# Patient Record
Sex: Male | Born: 1980 | Race: Black or African American | Hispanic: No | Marital: Married | Smoking: Former smoker
Health system: Southern US, Community
[De-identification: ages and names within clinical notes are randomized; demographics above are authoritative.]

## PROBLEM LIST (undated history)

## (undated) DIAGNOSIS — I10 Essential (primary) hypertension: Secondary | ICD-10-CM

## (undated) DIAGNOSIS — W3400XA Accidental discharge from unspecified firearms or gun, initial encounter: Secondary | ICD-10-CM

---
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
## 2002-03-07 ENCOUNTER — Emergency Department (HOSPITAL_COMMUNITY): Admission: EM | Admit: 2002-03-07 | Discharge: 2002-03-07 | Payer: Self-pay | Admitting: Emergency Medicine

## 2003-05-24 DIAGNOSIS — W3400XA Accidental discharge from unspecified firearms or gun, initial encounter: Secondary | ICD-10-CM

## 2003-05-24 HISTORY — DX: Accidental discharge from unspecified firearms or gun, initial encounter: W34.00XA

## 2004-02-07 ENCOUNTER — Encounter (INDEPENDENT_AMBULATORY_CARE_PROVIDER_SITE_OTHER): Payer: Self-pay | Admitting: *Deleted

## 2004-02-07 ENCOUNTER — Inpatient Hospital Stay (HOSPITAL_COMMUNITY): Admission: AC | Admit: 2004-02-07 | Discharge: 2004-02-13 | Payer: Self-pay

## 2004-02-15 ENCOUNTER — Inpatient Hospital Stay (HOSPITAL_COMMUNITY): Admission: EM | Admit: 2004-02-15 | Discharge: 2004-02-19 | Payer: Self-pay | Admitting: Emergency Medicine

## 2004-02-21 ENCOUNTER — Inpatient Hospital Stay (HOSPITAL_COMMUNITY): Admission: EM | Admit: 2004-02-21 | Discharge: 2004-02-24 | Payer: Self-pay | Admitting: Emergency Medicine

## 2004-03-09 ENCOUNTER — Encounter: Admission: RE | Admit: 2004-03-09 | Discharge: 2004-04-09 | Payer: Self-pay | Admitting: Physician Assistant

## 2004-03-16 ENCOUNTER — Emergency Department (HOSPITAL_COMMUNITY): Admission: EM | Admit: 2004-03-16 | Discharge: 2004-03-16 | Payer: Self-pay | Admitting: Emergency Medicine

## 2005-07-16 ENCOUNTER — Emergency Department (HOSPITAL_COMMUNITY): Admission: EM | Admit: 2005-07-16 | Discharge: 2005-07-16 | Payer: Self-pay | Admitting: Emergency Medicine
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
## 2005-09-15 ENCOUNTER — Emergency Department (HOSPITAL_COMMUNITY): Admission: EM | Admit: 2005-09-15 | Discharge: 2005-09-15 | Payer: Self-pay | Admitting: Emergency Medicine

## 2005-11-04 ENCOUNTER — Emergency Department (HOSPITAL_COMMUNITY): Admission: EM | Admit: 2005-11-04 | Discharge: 2005-11-05 | Payer: Self-pay | Admitting: Emergency Medicine

## 2006-06-15 ENCOUNTER — Emergency Department (HOSPITAL_COMMUNITY): Admission: EM | Admit: 2006-06-15 | Discharge: 2006-06-15 | Payer: Self-pay | Admitting: Emergency Medicine

## 2006-07-19 ENCOUNTER — Ambulatory Visit: Payer: Self-pay | Admitting: Family Medicine

## 2006-10-10 ENCOUNTER — Emergency Department (HOSPITAL_COMMUNITY): Admission: EM | Admit: 2006-10-10 | Discharge: 2006-10-10 | Payer: Self-pay | Admitting: Emergency Medicine

## 2006-10-20 ENCOUNTER — Emergency Department (HOSPITAL_COMMUNITY): Admission: EM | Admit: 2006-10-20 | Discharge: 2006-10-20 | Payer: Self-pay | Admitting: *Deleted

## 2006-12-12 ENCOUNTER — Emergency Department (HOSPITAL_COMMUNITY): Admission: EM | Admit: 2006-12-12 | Discharge: 2006-12-12 | Payer: Self-pay | Admitting: Emergency Medicine

## 2006-12-24 ENCOUNTER — Emergency Department (HOSPITAL_COMMUNITY): Admission: EM | Admit: 2006-12-24 | Discharge: 2006-12-24 | Payer: Self-pay | Admitting: Emergency Medicine

## 2007-01-20 ENCOUNTER — Emergency Department (HOSPITAL_COMMUNITY): Admission: EM | Admit: 2007-01-20 | Discharge: 2007-01-20 | Payer: Self-pay | Admitting: Emergency Medicine

## 2008-05-23 HISTORY — PX: COLOSTOMY REVERSAL: SHX5782

## 2008-10-13 ENCOUNTER — Emergency Department (HOSPITAL_COMMUNITY): Admission: EM | Admit: 2008-10-13 | Discharge: 2008-10-14 | Payer: Self-pay | Admitting: Emergency Medicine

## 2010-08-31 LAB — RAPID STREP SCREEN (MED CTR MEBANE ONLY): Streptococcus, Group A Screen (Direct): NEGATIVE
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
## 2011-03-04 LAB — CBC
HCT: 49.2
Hemoglobin: 16.7
MCHC: 33.9
MCV: 97.4
Platelets: 146 — ABNORMAL LOW
RBC: 5.05
RDW: 13.6
WBC: 8.1

## 2011-03-04 LAB — URINALYSIS, ROUTINE W REFLEX MICROSCOPIC
Bilirubin Urine: NEGATIVE
Glucose, UA: NEGATIVE
Hgb urine dipstick: NEGATIVE
Ketones, ur: NEGATIVE
Leukocytes, UA: NEGATIVE
Nitrite: NEGATIVE
Protein, ur: 100 — AB
Specific Gravity, Urine: 1.028
Urobilinogen, UA: 1
pH: 8.5 — ABNORMAL HIGH

## 2011-03-04 LAB — URINE MICROSCOPIC-ADD ON

## 2011-03-04 LAB — COMPREHENSIVE METABOLIC PANEL
ALT: 13
AST: 24
Albumin: 4.5
Alkaline Phosphatase: 58
BUN: 5 — ABNORMAL LOW
CO2: 28
Calcium: 9.9
Chloride: 103
Creatinine, Ser: 1.16
GFR calc Af Amer: >60
GFR calc non Af Amer: >60
Glucose, Bld: 91
Potassium: 4.6
Sodium: 139
Total Bilirubin: 0.9
Total Protein: 7.8

## 2011-03-04 LAB — DIFFERENTIAL
Basophils Absolute: 0.1
Basophils Relative: 1
Eosinophils Absolute: 0
Eosinophils Relative: 0
Lymphocytes Relative: 21
Lymphs Abs: 1.7
Monocytes Absolute: 0.5
Monocytes Relative: 6
Neutro Abs: 5.8
Neutrophils Relative %: 72

## 2011-03-04 LAB — LIPASE, BLOOD: Lipase: 19

## 2014-07-09 ENCOUNTER — Emergency Department (HOSPITAL_COMMUNITY)
Admission: EM | Admit: 2014-07-09 | Discharge: 2014-07-09 | Disposition: A | Discharge status: Home / Self Care | Payer: BLUE CROSS/BLUE SHIELD | Attending: Emergency Medicine | Admitting: Emergency Medicine

## 2014-07-09 ENCOUNTER — Encounter (HOSPITAL_COMMUNITY): Payer: Self-pay | Admitting: Emergency Medicine

## 2014-07-09 DIAGNOSIS — R51 Headache: Secondary | ICD-10-CM | POA: Insufficient documentation

## 2014-07-09 DIAGNOSIS — Z87828 Personal history of other (healed) physical injury and trauma: Secondary | ICD-10-CM | POA: Insufficient documentation

## 2014-07-09 DIAGNOSIS — N179 Acute kidney failure, unspecified: Secondary | ICD-10-CM

## 2014-07-09 DIAGNOSIS — R197 Diarrhea, unspecified: Secondary | ICD-10-CM | POA: Insufficient documentation

## 2014-07-09 DIAGNOSIS — Z87891 Personal history of nicotine dependence: Secondary | ICD-10-CM | POA: Diagnosis not present

## 2014-07-09 DIAGNOSIS — R112 Nausea with vomiting, unspecified: Secondary | ICD-10-CM

## 2014-07-09 DIAGNOSIS — I10 Essential (primary) hypertension: Secondary | ICD-10-CM | POA: Diagnosis not present

## 2014-07-09 HISTORY — DX: Accidental discharge from unspecified firearms or gun, initial encounter: W34.00XA

## 2014-07-09 HISTORY — DX: Essential (primary) hypertension: I10

## 2014-07-09 LAB — COMPREHENSIVE METABOLIC PANEL
ALT: 24 U/L (ref 0–53)
AST: 32 U/L (ref 0–37)
Albumin: 4.3 g/dL (ref 3.5–5.2)
Alkaline Phosphatase: 55 U/L (ref 39–117)
Anion gap: 4 — ABNORMAL LOW (ref 5–15)
BUN: 10 mg/dL (ref 6–23)
CO2: 33 mmol/L — ABNORMAL HIGH (ref 19–32)
Calcium: 9.5 mg/dL (ref 8.4–10.5)
Chloride: 100 mmol/L (ref 96–112)
Creatinine, Ser: 1.66 mg/dL — ABNORMAL HIGH (ref 0.50–1.35)
GFR calc Af Amer: 62 mL/min — ABNORMAL LOW (ref >90)
GFR calc non Af Amer: 53 mL/min — ABNORMAL LOW (ref >90)
Glucose, Bld: 136 mg/dL — ABNORMAL HIGH (ref 70–99)
Potassium: 3.8 mmol/L (ref 3.5–5.1)
Sodium: 137 mmol/L (ref 135–145)
Total Bilirubin: 1.3 mg/dL — ABNORMAL HIGH (ref 0.3–1.2)
Total Protein: 7.8 g/dL (ref 6.0–8.3)

## 2014-07-09 LAB — CBC WITH DIFFERENTIAL/PLATELET
Basophils Absolute: 0 10*3/uL (ref 0.0–0.1)
Basophils Relative: 0 % (ref 0–1)
Eosinophils Absolute: 0 10*3/uL (ref 0.0–0.7)
Eosinophils Relative: 0 % (ref 0–5)
HCT: 49.7 % (ref 39.0–52.0)
Hemoglobin: 17.4 g/dL — ABNORMAL HIGH (ref 13.0–17.0)
Lymphocytes Relative: 6 % — ABNORMAL LOW (ref 12–46)
Lymphs Abs: 0.5 10*3/uL — ABNORMAL LOW (ref 0.7–4.0)
MCH: 32.4 pg (ref 26.0–34.0)
MCHC: 35 g/dL (ref 30.0–36.0)
MCV: 92.6 fL (ref 78.0–100.0)
Monocytes Absolute: 0.7 10*3/uL (ref 0.1–1.0)
Monocytes Relative: 8 % (ref 3–12)
Neutro Abs: 7.1 10*3/uL (ref 1.7–7.7)
Neutrophils Relative %: 86 % — ABNORMAL HIGH (ref 43–77)
Platelets: 124 10*3/uL — ABNORMAL LOW (ref 150–400)
RBC: 5.37 MIL/uL (ref 4.22–5.81)
RDW: 13.6 % (ref 11.5–15.5)
WBC: 8.2 10*3/uL (ref 4.0–10.5)

## 2014-07-09 LAB — URINALYSIS, ROUTINE W REFLEX MICROSCOPIC
Bilirubin Urine: NEGATIVE
Glucose, UA: NEGATIVE mg/dL
Hgb urine dipstick: NEGATIVE
Ketones, ur: NEGATIVE mg/dL
Leukocytes, UA: NEGATIVE
Nitrite: NEGATIVE
Protein, ur: NEGATIVE mg/dL
Specific Gravity, Urine: 1.026 (ref 1.005–1.030)
Urobilinogen, UA: 1 mg/dL (ref 0.0–1.0)
pH: 7 (ref 5.0–8.0)

## 2014-07-09 LAB — LIPASE, BLOOD: Lipase: 31 U/L (ref 11–59)

## 2014-07-09 MED ORDER — ONDANSETRON HCL 4 MG/2ML IJ SOLN
4.0000 mg | Freq: Once | INTRAMUSCULAR | Status: AC
  Administered 2014-07-09: 4 mg via INTRAVENOUS
  Filled 2014-07-09: qty 2

## 2014-07-09 MED ORDER — SODIUM CHLORIDE 0.9 % IV BOLUS (SEPSIS)
1000.0000 mL | Freq: Once | INTRAVENOUS | Status: AC
  Administered 2014-07-09: 1000 mL via INTRAVENOUS

## 2014-07-09 MED ORDER — ONDANSETRON 4 MG PO TBDP: 4.0000 mg | ORAL_TABLET | Freq: Three times a day (TID) | ORAL | Status: AC | PRN

## 2014-07-09 MED ORDER — DICYCLOMINE HCL 20 MG PO TABS: 20.0000 mg | ORAL_TABLET | Freq: Two times a day (BID) | ORAL | Status: AC

## 2014-07-09 MED ORDER — KETOROLAC TROMETHAMINE 30 MG/ML IJ SOLN
30.0000 mg | Freq: Once | INTRAMUSCULAR | Status: AC
  Administered 2014-07-09: 30 mg via INTRAVENOUS
  Filled 2014-07-09: qty 1

## 2014-07-09 MED ORDER — ACETAMINOPHEN 500 MG PO TABS
1000.0000 mg | ORAL_TABLET | Freq: Once | ORAL | Status: AC
  Administered 2014-07-09: 1000 mg via ORAL
  Filled 2014-07-09: qty 2

## 2014-07-09 MED ORDER — LOPERAMIDE HCL 2 MG PO CAPS: 2.0000 mg | ORAL_CAPSULE | Freq: Four times a day (QID) | ORAL | Status: AC | PRN
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
## 2014-07-09 NOTE — Discharge Instructions (Signed)
You were seen in the emergency department for nausea, vomiting and diarrhea. This is likely caused by a viral gastroenteritis. This may take up to 7-10 days to resolve. If you notice worsening abdominal pain, vomiting and cannot stop, blood in your stool or black and tarry stools, please return to the hospital.  You were also found to have slightly elevated kidney function called creatinine. Your last creatinine was 1.16 and today is 1.66.  This is likely secondary to dehydration. We have given you 2 L of IV fluids in the emergency department and I recommend you increase your water intake. You'll need to have your creatinine rechecked by primary care physician in the next 1-2 weeks. Please avoid NSAIDs such as ibuprofen (motrin or Advil), aspirin, Aleve (naproxen). You may use Tylenol 1000 g every 6 hours as needed for fever and pain over-the-counter.   Acute Kidney Injury Acute kidney injury is a disease in which there is sudden (acute) damage to the kidneys. The kidneys are 2 organs that lie on either side of the spine between the middle of the back and the front of the abdomen. The kidneys:  Remove wastes and extra water from the blood.   Produce important hormones. These help keep bones strong, regulate blood pressure, and help create red blood cells.   Balance the fluids and chemicals in the blood and tissues. A small amount of kidney damage may not cause problems, but a large amount of damage may make it difficult or impossible for the kidneys to work the way they should. Acute kidney injury may develop into long-lasting (chronic) kidney disease. It may also develop into a life-threatening disease called end-stage kidney disease. Acute kidney injury can get worse very quickly, so it should be treated right away. Early treatment may prevent other kidney diseases from developing.  CAUSES   A problem with blood flow to the kidneys. This may be caused by:   Blood loss.   Heart disease.    Severe burns.   Liver disease.  Direct damage to the kidneys. This may be caused by:  Some medicines.   A kidney infection.   Poisoning or consuming toxic substances.   A surgical wound.   A blow to the kidney area.   A problem with urine flow. This may be caused by:   Cancer.   Kidney stones.   An enlarged prostate. SYMPTOMS   Swelling (edema) of the legs, ankles, or feet.   Tiredness (lethargy).   Nausea or vomiting.   Confusion.   Problems with urination, such as:   Painful or burning feeling during urination.   Decreased urine production.   Frequent accidents in children who are potty trained.   Bloody urine.   Muscle twitches and cramps.   Shortness of breath.   Seizures.   Chest pain or pressure. Sometimes, no symptoms are present. DIAGNOSIS Acute kidney injury may be detected and diagnosed by tests, including blood, urine, imaging, or kidney biopsy tests.  TREATMENT Treatment of acute kidney injury varies depending on the cause and severity of the kidney damage. In mild cases, no treatment may be needed. The kidneys may heal on their own. If acute kidney injury is more severe, your caregiver will treat the cause of the kidney damage, help the kidneys heal, and prevent complications from occurring. Severe cases may require a procedure to remove toxic wastes from the body (dialysis) or surgery to repair kidney damage. Surgery may involve:   Repair of a torn kidney.  Removal of an obstruction. Most of the time, you will need to stay overnight at the hospital.  HOME CARE INSTRUCTIONS:  Follow your prescribed diet.  Only take over-the-counter or prescription medicines as directed by your caregiver.  Do not take any new medicines (prescription, over-the-counter, or nutritional supplements) unless approved by your caregiver. Many medicines can worsen your kidney damage or need to have the dose adjusted.   Keep all  follow-up appointments as directed by your caregiver.  Observe your condition to make sure you are healing as expected. SEEK IMMEDIATE MEDICAL CARE IF:  You are feeling ill or have severe pain in the back or side.   Your symptoms return or you have new symptoms.  You have any symptoms of end-stage kidney disease. These include:   Persistent itchiness.   Loss of appetite.   Headaches.   Abnormally dark or light skin.  Numbness in the hands or feet.   Easy bruising.   Frequent hiccups.   Menstruation stops.   You have a fever.  You have increased urine production.  You have pain or bleeding when urinating. MAKE SURE YOU:   Understand these instructions.  Will watch your condition.  Will get help right away if you are not doing well or get worse Document Released: 11/22/2010 Document Revised: 09/03/2012 Document Reviewed: 01/06/2012 [facility] Patient Information 2015 [place], [place]. This information is not intended to replace advice given to you by your health care provider. Make sure you discuss any questions you have with your health care provider.   Dehydration, Adult Dehydration is when you lose more fluids from the body than you take in. Vital organs like the kidneys, brain, and heart cannot function without a proper amount of fluids and salt. Any loss of fluids from the body can cause dehydration.  CAUSES   Vomiting.  Diarrhea.  Excessive sweating.  Excessive urine output.  Fever. SYMPTOMS  Mild dehydration  Thirst.  Dry lips.  Slightly dry mouth. Moderate dehydration  Very dry mouth.  Sunken eyes.  Skin does not bounce back quickly when lightly pinched and released.  Dark urine and decreased urine production.  Decreased tear production.  Headache. Severe dehydration  Very dry mouth.  Extreme thirst.  Rapid, weak pulse (more than 100 beats per minute at rest).  Cold hands and feet.  Not able to sweat in spite of heat  and temperature.  Rapid breathing.  Blue lips.  Confusion and lethargy.  Difficulty being awakened.  Minimal urine production.  No tears. DIAGNOSIS  Your caregiver will diagnose dehydration based on your symptoms and your exam. Blood and urine tests will help confirm the diagnosis. The diagnostic evaluation should also identify the cause of dehydration. TREATMENT  Treatment of mild or moderate dehydration can often be done at home by increasing the amount of fluids that you drink. It is best to drink small amounts of fluid more often. Drinking too much at one time can make vomiting worse. Refer to the home care instructions below. Severe dehydration needs to be treated at the hospital where you will probably be given intravenous (IV) fluids that contain water and electrolytes. HOME CARE INSTRUCTIONS   Ask your caregiver about specific rehydration instructions.  Drink enough fluids to keep your urine clear or pale yellow.  Drink small amounts frequently if you have nausea and vomiting.  Eat as you normally do.  Avoid:  Foods or drinks high in sugar.  Carbonated drinks.  Juice.  Extremely hot or cold fluids.  Drinks with caffeine.  Fatty, greasy foods.  Alcohol.  Tobacco.  Overeating.  Gelatin desserts.  Wash your hands well to avoid spreading bacteria and viruses.  Only take over-the-counter or prescription medicines for pain, discomfort, or fever as directed by your caregiver.  Ask your caregiver if you should continue all prescribed and over-the-counter medicines.  Keep all follow-up appointments with your caregiver. SEEK MEDICAL CARE IF:  You have abdominal pain and it increases or stays in one area (localizes).  You have a rash, stiff neck, or severe headache.  You are irritable, sleepy, or difficult to awaken.  You are weak, dizzy, or extremely thirsty. SEEK IMMEDIATE MEDICAL CARE IF:   You are unable to keep fluids down or you get worse despite  treatment.  You have frequent episodes of vomiting or diarrhea.  You have blood or green matter (bile) in your vomit.  You have blood in your stool or your stool looks black and tarry.  You have not urinated in 6 to 8 hours, or you have only urinated a small amount of very dark urine.  You have a fever.  You faint. MAKE SURE YOU:   Understand these instructions.  Will watch your condition.  Will get help right away if you are not doing well or get worse. Document Released: 05/09/2005 Document Revised: 08/01/2011 Document Reviewed: 12/27/2010 [employer] Patient Information 2015 [place], [place]. This information is not intended to replace advice given to you by your health care provider. Make sure you discuss any questions you have with your health care provider.   Viral Gastroenteritis Viral gastroenteritis is also known as stomach flu. This condition affects the stomach and intestinal tract. It can cause sudden diarrhea and vomiting. The illness typically lasts 3 to 8 days. Most people develop an immune response that eventually gets rid of the virus. While this natural response develops, the virus can make you quite ill. CAUSES  Many different viruses can cause gastroenteritis, such as rotavirus or noroviruses. You can catch one of these viruses by consuming contaminated food or water. You may also catch a virus by sharing utensils or other personal items with an infected person or by touching a contaminated surface. SYMPTOMS  The most common symptoms are diarrhea and vomiting. These problems can cause a severe loss of body fluids (dehydration) and a body salt (electrolyte) imbalance. Other symptoms may include:  Fever.  Headache.  Fatigue.  Abdominal pain. DIAGNOSIS  Your caregiver can usually diagnose viral gastroenteritis based on your symptoms and a physical exam. A stool sample may also be taken to test for the presence of viruses or other infections. TREATMENT  This  illness typically goes away on its own. Treatments are aimed at rehydration. The most serious cases of viral gastroenteritis involve vomiting so severely that you are not able to keep fluids down. In these cases, fluids must be given through an intravenous line (IV). HOME CARE INSTRUCTIONS   Drink enough fluids to keep your urine clear or pale yellow. Drink small amounts of fluids frequently and increase the amounts as tolerated.  Ask your caregiver for specific rehydration instructions.  Avoid:  Foods high in sugar.  Alcohol.  Carbonated drinks.  Tobacco.  Juice.  Caffeine drinks.  Extremely hot or cold fluids.  Fatty, greasy foods.  Too much intake of anything at one time.  Dairy products until 24 to 48 hours after diarrhea stops.  You may consume probiotics. Probiotics are active cultures of beneficial bacteria. They may lessen the amount  and number of diarrheal stools in adults. Probiotics can be found in yogurt with active cultures and in supplements.  Wash your hands well to avoid spreading the virus.  Only take over-the-counter or prescription medicines for pain, discomfort, or fever as directed by your caregiver. Do not give aspirin to children. Antidiarrheal medicines are not recommended.  Ask your caregiver if you should continue to take your regular prescribed and over-the-counter medicines.  Keep all follow-up appointments as directed by your caregiver. SEEK IMMEDIATE MEDICAL CARE IF:   You are unable to keep fluids down.  You do not urinate at least once every 6 to 8 hours.  You develop shortness of breath.  You notice blood in your stool or vomit. This may look like coffee grounds.  You have abdominal pain that increases or is concentrated in one small area (localized).  You have persistent vomiting or diarrhea.  You have a fever.  The patient is a child younger than 3 months, and he or she has a fever.  The patient is a child older than 3 months,  and he or she has a fever and persistent symptoms.  The patient is a child older than 3 months, and he or she has a fever and symptoms suddenly get worse.  The patient is a baby, and he or she has no tears when crying. MAKE SURE YOU:   Understand these instructions.  Will watch your condition.  Will get help right away if you are not doing well or get worse. Document Released: 05/09/2005 Document Revised: 08/01/2011 Document Reviewed: 02/23/2011 [facility] Patient Information 2015 [place], [place]. This information is not intended to replace advice given to you by your health care provider. Make sure you discuss any questions you have with your health care provider.      Emergency Department Resource Guide 1) Find a Doctor and Pay Out of Pocket Although you won't have to find out who is covered by your insurance plan, it is a good idea to ask around and get recommendations. You will then need to call the office and see if the doctor you have chosen will accept you as a new patient and what types of options they offer for patients who are self-pay. Some doctors offer discounts or will set up payment plans for their patients who do not have insurance, but you will need to ask so you aren't surprised when you get to your appointment.  2) Contact Your Local Health Department Not all health departments have doctors that can see patients for sick visits, but many do, so it is worth a call to see if yours does. If you don't know where your local health department is, you can check in your phone book. The CDC also has a tool to help you locate your state's health department, and many state websites also have listings of all of their local health departments.  3) Find a Walk-in Clinic If your illness is not likely to be very severe or complicated, you may want to try a walk in clinic. These are popping up all over the country in pharmacies, drugstores, and shopping centers. They're usually staffed by  nurse practitioners or physician assistants that have been trained to treat common illnesses and complaints. They're usually fairly quick and inexpensive. However, if you have serious medical issues or chronic medical problems, these are probably not your best option.  No Primary Care Doctor: - Call Health Connect at  [phone] - they can help you locate a  primary care doctor that  accepts your insurance, provides certain services, etc. - Physician Referral Service- [phone]  Chronic Pain Problems: Organization         Address  Phone   Notes  [facility]  [phone] Patients need to be referred by their primary care doctor.   Medication Assistance: Organization         Address  Phone   Notes  [facility] Medication [facility] [address] [place], [place] [address] [phone] --Must be a resident of [facility] -- Must have NO insurance coverage whatsoever (no Medicaid/ Medicare, etc.) -- The pt. MUST have a primary care doctor that directs their care regularly and follows them in the community   [facility]  [phone]   [employer]  [phone]    Agencies that provide inexpensive medical care: Organization         Address  Phone   Notes  [facility]  [phone]   [facility]    [phone]   [facility] [address] [place], [place] [address] [phone]   [facility] [address] [place], [place] [phone]   Planned Parenthood    [phone]   [facility]    [phone]   [facility]  [address], [place] Phone:  [phone], Fax:  [phone] Hours of Operation:  9 am - 6 pm, M-F.  Also accepts Medicaid/Medicare and self-pay.  [facility]  [address], [place] Phone: [phone], Fax: [phone]. Hours of Operation:  8:30  am - 5:30 pm, M-F.  Also accepts Medicaid and self-pay.  [facility] [place] [address], [place] Phone: [phone]   [facility] [address] Natasha Bence [place], [place] [phone], Ext. [phone] Mondays & Thursdays: 7-9 AM.  First 15 patients are seen on a first come, first serve basis.    Medicaid-accepting [facility] Providers:  Organization         Address  Phone   Notes  [facility] [address], [place] [phone] Also accepts self-pay patients.  [facility] [address] Laurell Josephs [place], [place]  [phone]   [facility] [address], [place] [phone]   [facility] [address], [place] [phone]   Renaye Rakers [address], [place]   [phone] Only accepts [place] patients after they have their name applied to their card.   Self-Pay (no insurance) in [facility]:  Organization         Address  Phone   Notes  Sickle Cell Patients, [facility] [address] [place], [place] [phone]   [facility] [address] [place], [place] [phone]   [facility] [place]  [address], [place] [phone]   [facility]/Dr. Osei-Bonsu  [address] [place] or [address], [place] [phone] Phone number for both [place] and [place] locations is the same.  Urgent Medical and [facility] [address] [place] [phone]   [facility] [address], [place] or [address] [phone]  [facility] [address], [place] [phone], phone; [phone], fax Sees patients 1st and 3rd Saturday of every month.  Must not qualify for public or private insurance (i.e. Medicaid, Medicare, [place] Health  Choice, Veterans' Benefits)  Household income should be no more than 200% of the poverty level The clinic cannot treat you if you are pregnant or think you are pregnant  Sexually transmitted diseases are not treated at the clinic.    Dental Care: Organization         Address  Phone  Notes  [facility] Department of [facility] [address] [place], [place] [phone] Accepts children up to age 38 who are enrolled in [place] or [place] Health Choice; pregnant women with a Medicaid card; and children who have applied for Medicaid or [address] [place] Health Choice, but were declined, whose parents can pay a reduced fee at time of service.  [facility]  [address], [place] [phone] Accepts children up to age 45 who are enrolled in [place] or [place] Health Choice; pregnant women with a Medicaid card; and children who have applied for Medicaid or [place] Health Choice, but were declined, whose parents can pay a reduced fee at time of service.  [facility]  [address] [place], [place] [phone] Patients are seen by appointment only. Walk-ins are not accepted. [facility] will see patients 97 years of age and older. Monday - Tuesday (8am-5pm) Most Wednesdays (8:30-5pm) $30 per visit, cash only  [facility]  [address], [facility] [phone] Patients are seen by appointment only. Walk-ins are not accepted. [facility] will see patients 60 years of age and older. One Wednesday Evening (Monthly: Volunteer Based).  $30 per visit, cash only  [employer]  [phone] for adults; Children under age 72, call Graduate Pediatric Dentistry at [phone]. Children aged 71-14, please call [phone] to request a pediatric application.  Dental services are provided in all areas of dental care including fillings, crowns and bridges, complete  and partial dentures, implants, gum treatment, root canals, and extractions. Preventive care is also provided. Treatment is provided to both adults and children. Patients are selected via a lottery and there is often a waiting list.   [facility] [address] [place]  [phone] [email or web address]   [facility] [address] [place], [place] [phone], Ext. [phone] Second and Fourth Thursday of each month, opens at 6:30 AM; Clinic ends at 9 AM.  Patients are seen on a first-come first-served basis, and a limited number are seen during each clinic.   [facility]  [address] Ether Griffins [place], [place] [phone]   Eligibility Requirements You must have lived in [place], [place], or [place] counties for at least the last three months.   You cannot be eligible for state or federal sponsored [place], including [employer], [place], or [employer].   You generally cannot be eligible for healthcare insurance through your employer.    How to apply: Eligibility screenings are held every Tuesday and Wednesday afternoon from 1:00 pm until 4:00 pm. You do not need an appointment for the interview!  [facility] [address] [place], [place] [phone]   [facility] Health Department  [phone]   [facility]  [phone]   [facility]  Department  [phone]    Behavioral Health Resources in the Community: Intensive Outpatient Programs Organization         Address  Phone  Notes  [place]. [address], [place], [place] [phone]   [facility] [address] [place] [address], [place], [place]   [facility] [address], [place], [place]   [place] [address]  [place], [place] or [phone]   Substance Abuse Resources Organization          Address  Phone  Notes  Al[facility] Drug Services  [phone]   [place]  [phone]   [facility] [place]   Chinita Pester  [phone]   Residential & Outpatient Substance Abuse Program  [phone]   Psychological Services Organization         Address  Phone  Notes  [facility] [place]  [place]  [phone]   [place] [address], [place] or [phone]    Mobile Crisis Teams Organization         Address  Phone  Notes  [facility], Mobile Crisis Care Unit  [phone]   [facility]  [address] [place], [place]   [place] [address], [place] [phone]    Self-Help/Support Groups Organization         Address  Phone             Notes  [place] [place] - variety of support groups  [place] Call for more information  Narcotics Anonymous (NA), [facility] [address], [employer] [place]  2 meetings at this location   Special educational needs teacher         Address  Phone  Notes  [facility] [place],    [place]  [phone]   [facility]  [address] [place] [address], [place], [place]   [place], [place] [phone] Admissions: 8am-3pm M-F  Incentives Substance [place] [address]    [place], [place] [phone]   [facility] [address] [place], [place], [place]   [facility] [address],  [place], [place]   [facility] - Intensive Outpatient [address] Kristeen Mans [address], [place], [place]   [facility] [address].,  [place], [place] [phone] or [phone]   [facility] (RT[facility]) [address], [place], [place] Accepts Medicaid  [facility] [place] [address]  [place] [phone] Substance Abuse/Addiction Treatment   [facility] Organization         Address  Phone  Notes  [facility]  [phone]   Domenic Schwab, PhD [address] Arlis Porta [place], [place]   [phone] or [phone]   [place], [place] [phone]   [place] [address] [place], [place] [phone] Insurance/Medicaid/sponsorship through [employer] and Families [address] [place]                                    [place], [place] [phone] Therapy/tele-psych/case  [facility]  [facility] [place], [place] [phone]    Dr. Adele Schilder  [phone]   [facility] Dept. 1) [address], [place] 2) [place] 3)  [address], [place] [phone]  [phone]   [place] [phone] or [phone] (After Hours)

## 2014-07-09 NOTE — ED Notes (Signed)
Waiting on discharge papers, pt aware of delay.

## 2014-07-09 NOTE — ED Notes (Signed)
Pt reports nausea, vomiting, diarrhea and generalized body aches x 2 days. Pt also reports chills and periods of diaphoresis. Pt denies fevers.

## 2014-11-11 ENCOUNTER — Emergency Department (INDEPENDENT_AMBULATORY_CARE_PROVIDER_SITE_OTHER)
Admission: EM | Admit: 2014-11-11 | Discharge: 2014-11-11 | Disposition: A | Discharge status: Home / Self Care | Payer: BLUE CROSS/BLUE SHIELD | Source: Home / Self Care | Attending: Family Medicine | Admitting: Family Medicine

## 2014-11-11 ENCOUNTER — Encounter (HOSPITAL_COMMUNITY): Payer: Self-pay | Admitting: Emergency Medicine

## 2014-11-11 ENCOUNTER — Other Ambulatory Visit (HOSPITAL_COMMUNITY)
Admission: RE | Admit: 2014-11-11 | Discharge: 2014-11-11 | Disposition: A | Discharge status: Home / Self Care | Payer: BLUE CROSS/BLUE SHIELD | Source: Ambulatory Visit | Attending: Family Medicine | Admitting: Family Medicine

## 2014-11-11 DIAGNOSIS — Z202 Contact with and (suspected) exposure to infections with a predominantly sexual mode of transmission: Secondary | ICD-10-CM

## 2014-11-11 DIAGNOSIS — Z113 Encounter for screening for infections with a predominantly sexual mode of transmission: Secondary | ICD-10-CM | POA: Insufficient documentation

## 2014-11-11 DIAGNOSIS — N489 Disorder of penis, unspecified: Secondary | ICD-10-CM

## 2014-11-11 LAB — POCT URINALYSIS DIP (DEVICE)
Bilirubin Urine: NEGATIVE
Glucose, UA: NEGATIVE mg/dL
Hgb urine dipstick: NEGATIVE
Ketones, ur: NEGATIVE mg/dL
Leukocytes, UA: NEGATIVE
Nitrite: NEGATIVE
Protein, ur: NEGATIVE mg/dL
Specific Gravity, Urine: 1.025 (ref 1.005–1.030)
Urobilinogen, UA: 1 mg/dL (ref 0.0–1.0)
pH: 7 (ref 5.0–8.0)

## 2014-11-11 MED ORDER — AZITHROMYCIN 250 MG PO TABS
1000.0000 mg | ORAL_TABLET | Freq: Every day | ORAL | Status: DC
  Administered 2014-11-11: 1000 mg via ORAL

## 2014-11-11 MED ORDER — CEFTRIAXONE SODIUM 250 MG IJ SOLR
250.0000 mg | Freq: Once | INTRAMUSCULAR | Status: AC
  Administered 2014-11-11: 250 mg via INTRAMUSCULAR

## 2014-11-11 MED ORDER — AZITHROMYCIN 250 MG PO TABS
ORAL_TABLET | ORAL | Status: AC
  Filled 2014-11-11: qty 4

## 2014-11-11 MED ORDER — CEFTRIAXONE SODIUM 250 MG IJ SOLR
INTRAMUSCULAR | Status: AC
  Filled 2014-11-11: qty 250

## 2014-11-11 MED ORDER — LIDOCAINE HCL (PF) 1 % IJ SOLN
INTRAMUSCULAR | Status: AC
  Filled 2014-11-11: qty 5
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
## 2014-11-11 NOTE — Discharge Instructions (Signed)
Sexually Transmitted Disease A sexually transmitted disease (STD) is a disease or infection often passed to another person during sex. However, STDs can be passed through nonsexual ways. An STD can be passed through:  Spit (saliva).  Semen.  Blood.  Mucus from the vagina.  Pee (urine). HOW CAN I LESSEN MY CHANCES OF GETTING AN STD?  Use:  Latex condoms.  Water-soluble lubricants with condoms. Do not use petroleum jelly or oils.  Dental dams. These are small pieces of latex that are used as a barrier during oral sex.  Avoid having more than one sex partner.  Do not have sex with someone who has other sex partners.  Do not have sex with anyone you do not know or who is at high risk for an STD.  Avoid risky sex that can break your skin.  Do not have sex if you have open sores on your mouth or skin.  Avoid drinking too much alcohol or taking illegal drugs. Alcohol and drugs can affect your good judgment.  Avoid oral and anal sex acts.  Get shots (vaccines) for HPV and hepatitis.  If you are at risk of being infected with HIV, it is advised that you take a certain medicine daily to prevent HIV infection. This is called pre-exposure prophylaxis (PrEP). You may be at risk if:  You are a man who has sex with other men (MSM).  You are attracted to the opposite sex (heterosexual) and are having sex with more than one partner.  You take drugs with a needle.  You have sex with someone who has HIV.  Talk with your doctor about if you are at high risk of being infected with HIV. If you begin to take PrEP, get tested for HIV first. Get tested every 3 months for as long as you are taking PrEP. WHAT SHOULD I DO IF I THINK I HAVE AN STD?  See your doctor.  Tell your sex partner(s) that you have an STD. They should be tested and treated.  Do not have sex until your doctor says it is okay. WHEN SHOULD I GET HELP? Get help right away if:  You have bad belly (abdominal)  pain.  You are a man and have puffiness (swelling) or pain in your testicles.  You are a woman and have puffiness in your vagina. Document Released: 06/16/2004 Document Revised: 05/14/2013 Document Reviewed: 11/02/2012 [facility] Patient Information 2015 [place], [place]. This information is not intended to replace advice given to you by your health care provider. Make sure you discuss any questions you have with your health care provider.  Safe Sex Safe sex is about reducing the risk of giving or getting a sexually transmitted disease (STD). STDs are spread through sexual contact involving the genitals, mouth, or rectum. Some STDs can be cured and others cannot. Safe sex can also prevent unintended pregnancies.  WHAT ARE SOME SAFE SEX PRACTICES?  Limit your sexual activity to only one partner who is having sex with only you.  Talk to your partner about his or her past partners, past STDs, and drug use.  Use a condom every time you have sexual intercourse. This includes vaginal, oral, and anal sexual activity. Both females and males should wear condoms during oral sex. Only use latex or polyurethane condoms and water-based lubricants. Using petroleum-based lubricants or oils to lubricate a condom will weaken the condom and increase the chance that it will break. The condom should be in place from the beginning to the end  of sexual activity. Wearing a condom reduces, but does not completely eliminate, your risk of getting or giving an STD. STDs can be spread by contact with infected body fluids and skin. °· Get vaccinated for hepatitis B and HPV. °· Avoid alcohol and recreational drugs, which can affect your judgment. You may forget to use a condom or participate in high-risk sex. °· For females, avoid douching after sexual intercourse. Douching can spread an infection farther into the reproductive tract. °· Check your body for signs of sores, blisters, rashes, or unusual discharge. See your health care  provider if you notice any of these signs. °· Avoid sexual contact if you have symptoms of an infection or are being treated for an STD. If you or your partner has herpes, avoid sexual contact when blisters are present. Use condoms at all other times. °· If you are at risk of being infected with HIV, it is recommended that you take a prescription medicine daily to prevent HIV infection. This is called pre-exposure prophylaxis (PrEP). You are considered at risk if: °¨ You are a man who has sex with other men (MSM). °¨ You are a heterosexual man or woman who is sexually active with more than one partner. °¨ You take drugs by injection. °¨ You are sexually active with a partner who has HIV. °· Talk with your health care provider about whether you are at high risk of being infected with HIV. If you choose to begin PrEP, you should first be tested for HIV. You should then be tested every 3 months for as long as you are taking PrEP. °· See your health care provider for regular screenings, exams, and tests for other STDs. Before having sex with a new partner, each of you should be screened for STDs and should talk about the results with each other. °WHAT ARE THE BENEFITS OF SAFE SEX?  °· There is less chance of getting or giving an STD. °· You can prevent unwanted or unintended pregnancies. °· By discussing safe sex concerns with your partner, you may increase feelings of intimacy, comfort, trust, and honesty between the two of you. °Document Released: 06/16/2004 Document Revised: 09/23/2013 Document Reviewed: 10/31/2011 °ExitCare® Patient Information ©2015 [employer], LLC. This information is not intended to replace advice given to you by your health care provider. Make sure you discuss any questions you have with your health care provider. ° °

## 2014-11-11 NOTE — ED Notes (Signed)
Pt states that he has had tingling in his penis for 2 days and wants to get checked for STD

## 2014-11-12 LAB — URINE CYTOLOGY ANCILLARY ONLY
CHLAMYDIA, DNA PROBE: NEGATIVE
NEISSERIA GONORRHEA: NEGATIVE
TRICH (WINDOWPATH): NEGATIVE

## 2014-11-17 NOTE — ED Notes (Signed)
Patient called to check on his results, and after verifying ID discussed his negative findings

## 2015-07-09 ENCOUNTER — Emergency Department (HOSPITAL_COMMUNITY): Payer: BLUE CROSS/BLUE SHIELD

## 2015-07-09 ENCOUNTER — Encounter (HOSPITAL_COMMUNITY): Payer: Self-pay | Admitting: Vascular Surgery

## 2015-07-09 ENCOUNTER — Emergency Department (HOSPITAL_COMMUNITY)
Admission: EM | Admit: 2015-07-09 | Discharge: 2015-07-09 | Disposition: A | Discharge status: Home / Self Care | Payer: BLUE CROSS/BLUE SHIELD | Attending: Emergency Medicine | Admitting: Emergency Medicine

## 2015-07-09 DIAGNOSIS — I10 Essential (primary) hypertension: Secondary | ICD-10-CM | POA: Diagnosis not present

## 2015-07-09 DIAGNOSIS — S60811A Abrasion of right wrist, initial encounter: Secondary | ICD-10-CM | POA: Insufficient documentation

## 2015-07-09 DIAGNOSIS — M542 Cervicalgia: Secondary | ICD-10-CM

## 2015-07-09 DIAGNOSIS — S299XXA Unspecified injury of thorax, initial encounter: Secondary | ICD-10-CM | POA: Diagnosis not present

## 2015-07-09 DIAGNOSIS — S0990XA Unspecified injury of head, initial encounter: Secondary | ICD-10-CM | POA: Insufficient documentation

## 2015-07-09 DIAGNOSIS — Y998 Other external cause status: Secondary | ICD-10-CM | POA: Diagnosis not present

## 2015-07-09 DIAGNOSIS — Z87891 Personal history of nicotine dependence: Secondary | ICD-10-CM | POA: Diagnosis not present

## 2015-07-09 DIAGNOSIS — S199XXA Unspecified injury of neck, initial encounter: Secondary | ICD-10-CM | POA: Diagnosis not present

## 2015-07-09 DIAGNOSIS — M546 Pain in thoracic spine: Secondary | ICD-10-CM

## 2015-07-09 DIAGNOSIS — S60812A Abrasion of left wrist, initial encounter: Secondary | ICD-10-CM | POA: Diagnosis not present

## 2015-07-09 DIAGNOSIS — S6991XA Unspecified injury of right wrist, hand and finger(s), initial encounter: Secondary | ICD-10-CM | POA: Insufficient documentation

## 2015-07-09 DIAGNOSIS — Y9389 Activity, other specified: Secondary | ICD-10-CM | POA: Insufficient documentation

## 2015-07-09 DIAGNOSIS — Y92410 Unspecified street and highway as the place of occurrence of the external cause: Secondary | ICD-10-CM | POA: Diagnosis not present

## 2015-07-09 DIAGNOSIS — S6992XA Unspecified injury of left wrist, hand and finger(s), initial encounter: Secondary | ICD-10-CM | POA: Insufficient documentation

## 2015-07-09 DIAGNOSIS — M25532 Pain in left wrist: Secondary | ICD-10-CM

## 2015-07-09 DIAGNOSIS — M25531 Pain in right wrist: Secondary | ICD-10-CM

## 2015-07-09 LAB — I-STAT CHEM 8, ED
BUN: 7 mg/dL (ref 6–20)
CHLORIDE: 104 mmol/L (ref 101–111)
Calcium, Ion: 1.19 mmol/L (ref 1.12–1.23)
Creatinine, Ser: 1.2 mg/dL (ref 0.61–1.24)
Glucose, Bld: 87 mg/dL (ref 65–99)
HEMATOCRIT: 53 % — AB (ref 39.0–52.0)
Hemoglobin: 18 g/dL — ABNORMAL HIGH (ref 13.0–17.0)
POTASSIUM: 3.7 mmol/L (ref 3.5–5.1)
SODIUM: 142 mmol/L (ref 135–145)
TCO2: 25 mmol/L (ref 0–100)

## 2015-07-09 MED ORDER — HYDROCODONE-ACETAMINOPHEN 5-325 MG PO TABS: 1.0000 | ORAL_TABLET | Freq: Four times a day (QID) | ORAL | Status: AC | PRN

## 2015-07-09 MED ORDER — OXYCODONE-ACETAMINOPHEN 5-325 MG PO TABS
1.0000 | ORAL_TABLET | Freq: Once | ORAL | Status: AC
  Administered 2015-07-09: 1 via ORAL
  Filled 2015-07-09: qty 1

## 2015-07-09 MED ORDER — IOHEXOL 300 MG/ML  SOLN
100.0000 mL | Freq: Once | INTRAMUSCULAR | Status: AC | PRN
  Administered 2015-07-09: 100 mL via INTRAVENOUS

## 2015-07-09 NOTE — ED Notes (Signed)
c-collar applied  
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
## 2015-07-09 NOTE — ED Notes (Signed)
Pt states he understands instructions  
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
## 2016-04-13 ENCOUNTER — Emergency Department (HOSPITAL_COMMUNITY): Payer: BLUE CROSS/BLUE SHIELD

## 2016-04-13 ENCOUNTER — Emergency Department (HOSPITAL_COMMUNITY)
Admission: EM | Admit: 2016-04-13 | Discharge: 2016-04-13 | Disposition: A | Discharge status: Home / Self Care | Payer: BLUE CROSS/BLUE SHIELD | Attending: Physician Assistant | Admitting: Physician Assistant

## 2016-04-13 ENCOUNTER — Encounter (HOSPITAL_COMMUNITY): Payer: Self-pay | Admitting: Neurology

## 2016-04-13 DIAGNOSIS — J111 Influenza due to unidentified influenza virus with other respiratory manifestations: Secondary | ICD-10-CM | POA: Insufficient documentation

## 2016-04-13 DIAGNOSIS — Z87891 Personal history of nicotine dependence: Secondary | ICD-10-CM | POA: Diagnosis not present

## 2016-04-13 DIAGNOSIS — I10 Essential (primary) hypertension: Secondary | ICD-10-CM | POA: Insufficient documentation

## 2016-04-13 DIAGNOSIS — Z79899 Other long term (current) drug therapy: Secondary | ICD-10-CM | POA: Diagnosis not present

## 2016-04-13 DIAGNOSIS — R6889 Other general symptoms and signs: Secondary | ICD-10-CM

## 2016-04-13 DIAGNOSIS — R5383 Other fatigue: Secondary | ICD-10-CM | POA: Diagnosis present

## 2016-04-13 LAB — CBC
HCT: 51.1 % (ref 39.0–52.0)
HEMOGLOBIN: 18 g/dL — AB (ref 13.0–17.0)
MCH: 34 pg (ref 26.0–34.0)
MCHC: 35.2 g/dL (ref 30.0–36.0)
MCV: 96.4 fL (ref 78.0–100.0)
Platelets: 141 10*3/uL — ABNORMAL LOW (ref 150–400)
RBC: 5.3 MIL/uL (ref 4.22–5.81)
RDW: 13.7 % (ref 11.5–15.5)
WBC: 16.5 10*3/uL — ABNORMAL HIGH (ref 4.0–10.5)

## 2016-04-13 LAB — COMPREHENSIVE METABOLIC PANEL
ALBUMIN: 4 g/dL (ref 3.5–5.0)
ALK PHOS: 62 U/L (ref 38–126)
ALT: 11 U/L — AB (ref 17–63)
ANION GAP: 11 (ref 5–15)
AST: 22 U/L (ref 15–41)
BILIRUBIN TOTAL: 0.9 mg/dL (ref 0.3–1.2)
BUN: 5 mg/dL — AB (ref 6–20)
CALCIUM: 9.6 mg/dL (ref 8.9–10.3)
CO2: 24 mmol/L (ref 22–32)
Chloride: 103 mmol/L (ref 101–111)
Creatinine, Ser: 1.64 mg/dL — ABNORMAL HIGH (ref 0.61–1.24)
GFR calc Af Amer: >60 mL/min (ref >60)
GFR calc non Af Amer: 54 mL/min — ABNORMAL LOW (ref >60)
GLUCOSE: 114 mg/dL — AB (ref 65–99)
Potassium: 3.5 mmol/L (ref 3.5–5.1)
SODIUM: 138 mmol/L (ref 135–145)
TOTAL PROTEIN: 7.7 g/dL (ref 6.5–8.1)

## 2016-04-13 LAB — LIPASE, BLOOD: Lipase: 27 U/L (ref 11–51)

## 2016-04-13 MED ORDER — SODIUM CHLORIDE 0.9 % IV BOLUS (SEPSIS)
1000.0000 mL | Freq: Once | INTRAVENOUS | Status: AC
  Administered 2016-04-13: 1000 mL via INTRAVENOUS
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
## 2016-04-13 NOTE — ED Triage Notes (Signed)
Pt here reports 3 days of cough, body aches, fever. Reports yesterday has n/v with blood emesis and stool.

## 2016-04-13 NOTE — Discharge Instructions (Signed)
Please read attached information. If you experience any new or worsening signs or symptoms please return to the emergency room for evaluation. Please follow-up with your primary care provider or specialist as discussed.  °

## 2018-06-09 ENCOUNTER — Emergency Department
Admission: EM | Admit: 2018-06-09 | Discharge: 2018-06-09 | Disposition: A | Discharge status: Home / Self Care | Attending: Emergency Medicine | Admitting: Emergency Medicine

## 2018-06-09 ENCOUNTER — Emergency Department

## 2018-06-09 ENCOUNTER — Other Ambulatory Visit: Payer: Self-pay

## 2018-06-09 ENCOUNTER — Encounter: Payer: Self-pay | Admitting: Emergency Medicine

## 2018-06-09 DIAGNOSIS — Y939 Activity, unspecified: Secondary | ICD-10-CM | POA: Insufficient documentation

## 2018-06-09 DIAGNOSIS — S0083XA Contusion of other part of head, initial encounter: Secondary | ICD-10-CM | POA: Insufficient documentation

## 2018-06-09 DIAGNOSIS — Y999 Unspecified external cause status: Secondary | ICD-10-CM | POA: Insufficient documentation

## 2018-06-09 DIAGNOSIS — S0121XA Laceration without foreign body of nose, initial encounter: Secondary | ICD-10-CM | POA: Diagnosis not present

## 2018-06-09 DIAGNOSIS — Y929 Unspecified place or not applicable: Secondary | ICD-10-CM | POA: Diagnosis not present

## 2018-06-09 DIAGNOSIS — S0993XA Unspecified injury of face, initial encounter: Secondary | ICD-10-CM | POA: Diagnosis present

## 2018-06-09 DIAGNOSIS — I10 Essential (primary) hypertension: Secondary | ICD-10-CM | POA: Insufficient documentation

## 2018-06-09 DIAGNOSIS — Z87891 Personal history of nicotine dependence: Secondary | ICD-10-CM | POA: Insufficient documentation

## 2018-06-09 DIAGNOSIS — J342 Deviated nasal septum: Secondary | ICD-10-CM | POA: Diagnosis not present

## 2018-06-09 MED ORDER — IBUPROFEN 600 MG PO TABS: 600.0000 mg | ORAL_TABLET | Freq: Four times a day (QID) | ORAL | 0 refills | Status: AC | PRN

## 2018-06-09 MED ORDER — HYDROCODONE-ACETAMINOPHEN 5-325 MG PO TABS
1.0000 | ORAL_TABLET | Freq: Once | ORAL | Status: AC
  Administered 2018-06-09: 1 via ORAL
  Filled 2018-06-09: qty 1
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
## 2018-06-09 NOTE — ED Notes (Signed)
Pt verbalized understanding of discharge instructions. NAD at this time. 
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
## 2018-06-09 NOTE — Discharge Instructions (Signed)
Do not blow your nose for the next few days. Follow up with Dr. Elenore Rota if not improving over the next couple of weeks. Return to the ER for symptoms that change, worsen, or for new concerns if unable to see primary care or the specialist.
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
